# Patient Record
Sex: Female | Born: 1990 | Race: White | Hispanic: No | Marital: Single | Smoking: Current some day smoker
Health system: Southern US, Community
[De-identification: ages and names within clinical notes are randomized; demographics above are authoritative.]

## PROBLEM LIST (undated history)

## (undated) HISTORY — PX: WISDOM TOOTH EXTRACTION: SHX21

---

## 2021-02-14 ENCOUNTER — Emergency Department (HOSPITAL_BASED_OUTPATIENT_CLINIC_OR_DEPARTMENT_OTHER): Payer: 59

## 2021-02-14 ENCOUNTER — Other Ambulatory Visit: Payer: Self-pay

## 2021-02-14 ENCOUNTER — Emergency Department (HOSPITAL_BASED_OUTPATIENT_CLINIC_OR_DEPARTMENT_OTHER)
Admission: EM | Admit: 2021-02-14 | Discharge: 2021-02-14 | Disposition: A | Discharge status: Home / Self Care | Payer: 59 | Attending: Emergency Medicine | Admitting: Emergency Medicine

## 2021-02-14 ENCOUNTER — Encounter (HOSPITAL_BASED_OUTPATIENT_CLINIC_OR_DEPARTMENT_OTHER): Payer: Self-pay | Admitting: *Deleted

## 2021-02-14 DIAGNOSIS — J069 Acute upper respiratory infection, unspecified: Secondary | ICD-10-CM | POA: Diagnosis not present

## 2021-02-14 DIAGNOSIS — Z20822 Contact with and (suspected) exposure to covid-19: Secondary | ICD-10-CM | POA: Diagnosis not present

## 2021-02-14 DIAGNOSIS — F17210 Nicotine dependence, cigarettes, uncomplicated: Secondary | ICD-10-CM | POA: Insufficient documentation

## 2021-02-14 DIAGNOSIS — J029 Acute pharyngitis, unspecified: Secondary | ICD-10-CM | POA: Diagnosis present

## 2021-02-14 LAB — RAPID INFLUENZA A&B ANTIGENS
Influenza A (ARMC): NEGATIVE
Influenza B (ARMC): NEGATIVE

## 2021-02-14 NOTE — ED Provider Notes (Signed)
[facility] EMERGENCY DEPARTMENT Provider Note   CSN: [id number] Arrival date & time: 02/14/21  0740     History Chief Complaint  Patient presents with   Cough    Chloe Sims is a 30 y.o. female.  For the past week patient states that she has been having intermittent body aches, chills, sore throat.  No voice change, no difficulty swallowing, no difficulty breathing.  Does feel like her tonsils may be swollen.  No fevers that she is aware of but has had chills.  Denies any associated chest pain.  No vomiting.  No known sick contacts.  She denies any chronic medical problems.  HPI     No past medical history on file.  There are no problems to display for this patient.  OB History   No obstetric history on file.     History reviewed. No pertinent family history.  Social History   Tobacco Use   Smoking status: Some Days    Types: Cigarettes  Substance Use Topics   Alcohol use: Yes    Comment: social   Drug use: Yes    Types: Marijuana    Home Medications Prior to Admission medications   Not on File    Allergies    Patient has no known allergies.  Review of Systems   Review of Systems  Constitutional:  Positive for chills and fatigue. Negative for fever.  HENT:  Negative for ear pain and sore throat.   Eyes:  Negative for pain and visual disturbance.  Respiratory:  Positive for cough. Negative for shortness of breath.   Cardiovascular:  Negative for chest pain and palpitations.  Gastrointestinal:  Negative for abdominal pain and vomiting.  Genitourinary:  Negative for dysuria and hematuria.  Musculoskeletal:  Positive for arthralgias and myalgias. Negative for back pain.  Skin:  Negative for color change and rash.  Neurological:  Negative for seizures and syncope.  All other systems reviewed and are negative.  Physical Exam Updated Vital Signs BP (!) 145/70 (BP Location: Right Arm)   Pulse 74   Temp 98.5 F (36.9 C) (Oral)   Resp  18   Ht 5\' 3"  (1.6 m)   Wt 81.6 kg   SpO2 99%   BMI 31.89 kg/m   Physical Exam Vitals and nursing note reviewed.  Constitutional:      General: She is not in acute distress.    Appearance: She is well-developed.  HENT:     Head: Normocephalic and atraumatic.     Mouth/Throat:     Comments: No significant erythema or exudates appreciated, oropharynx patent Eyes:     Conjunctiva/sclera: Conjunctivae normal.  Cardiovascular:     Rate and Rhythm: Normal rate and regular rhythm.     Heart sounds: No murmur heard. Pulmonary:     Effort: Pulmonary effort is normal. No respiratory distress.     Breath sounds: Normal breath sounds.  Abdominal:     Palpations: Abdomen is soft.     Tenderness: There is no abdominal tenderness.  Musculoskeletal:     Cervical back: Neck supple.  Skin:    General: Skin is warm and dry.  Neurological:     General: No focal deficit present.     Mental Status: She is alert.    ED Results / Procedures / Treatments   Labs (all labs ordered are listed, but only abnormal results are displayed) Labs Reviewed  RAPID INFLUENZA A&B ANTIGENS  SARS CORONAVIRUS 2 (TAT 6-24 HRS)  EKG None  Radiology DG Chest Portable 1 View  Result Date: 02/14/2021 CLINICAL DATA:  Cough, chest pain EXAM: PORTABLE CHEST 1 VIEW COMPARISON:  None. FINDINGS: Normal heart size. Normal mediastinal contour. No pneumothorax. No pleural effusion. Lungs appear clear, with no acute consolidative airspace disease and no pulmonary edema. IMPRESSION: No active disease. Electronically Signed   By: Ilona Sorrel M.D.   On: 02/14/2021 10:49    Procedures Procedures   Medications Ordered in ED Medications - No data to display  ED Course  I have reviewed the triage vital signs and the nursing notes.  Pertinent labs & imaging results that were available during my care of the patient were reviewed by me and considered in my medical decision making (see chart for details).    MDM  Rules/Calculators/A&P                          30 year old lady presents to ER with concern for congestion and cough over the past couple weeks.  She is currently nontoxic-appearing, grossly normal vital signs, lungs are clear to auscultation bilaterally.  Suspect patient may have viral upper respiratory illness.  Recommend supportive care for now and follow-up with her primary care doctor.  After the discussed management above, the patient was determined to be safe for discharge.  The patient was in agreement with this plan and all questions regarding their care were answered.  ED return precautions were discussed and the patient will return to the ED with any significant worsening of condition.   Final Clinical Impression(s) / ED Diagnoses Final diagnoses:  Viral URI with cough    Rx / DC Orders ED Discharge Orders     None        Lucrezia Starch, MD 02/15/21 [phone]

## 2021-02-14 NOTE — Discharge Instructions (Signed)
Follow-up with your primary care doctor later this week.  If you develop difficulty breathing, chest pain or other new concerning symptom, come back to ER for reassessment.  The flu test was negative.  The x-ray of your chest did not show any pneumonia.  We suspect you most likely have a viral upper respiratory illness.

## 2021-02-14 NOTE — ED Triage Notes (Signed)
Having body aches, chills, sore throat for the past week. Has a strong cough. Feels like her tonsils are swollen

## 2021-02-15 LAB — SARS CORONAVIRUS 2 (TAT 6-24 HRS): SARS Coronavirus 2: NEGATIVE

## 2022-03-10 ENCOUNTER — Ambulatory Visit: Payer: 59 | Admitting: Nurse Practitioner

## 2023-08-13 IMAGING — DX DG CHEST 1V PORT
1 series · 1 of 1 positions shown · non-contrast
Comparison: None.

CLINICAL DATA: Cough, chest pain

EXAM:
PORTABLE CHEST 1 VIEW

[chest ap]
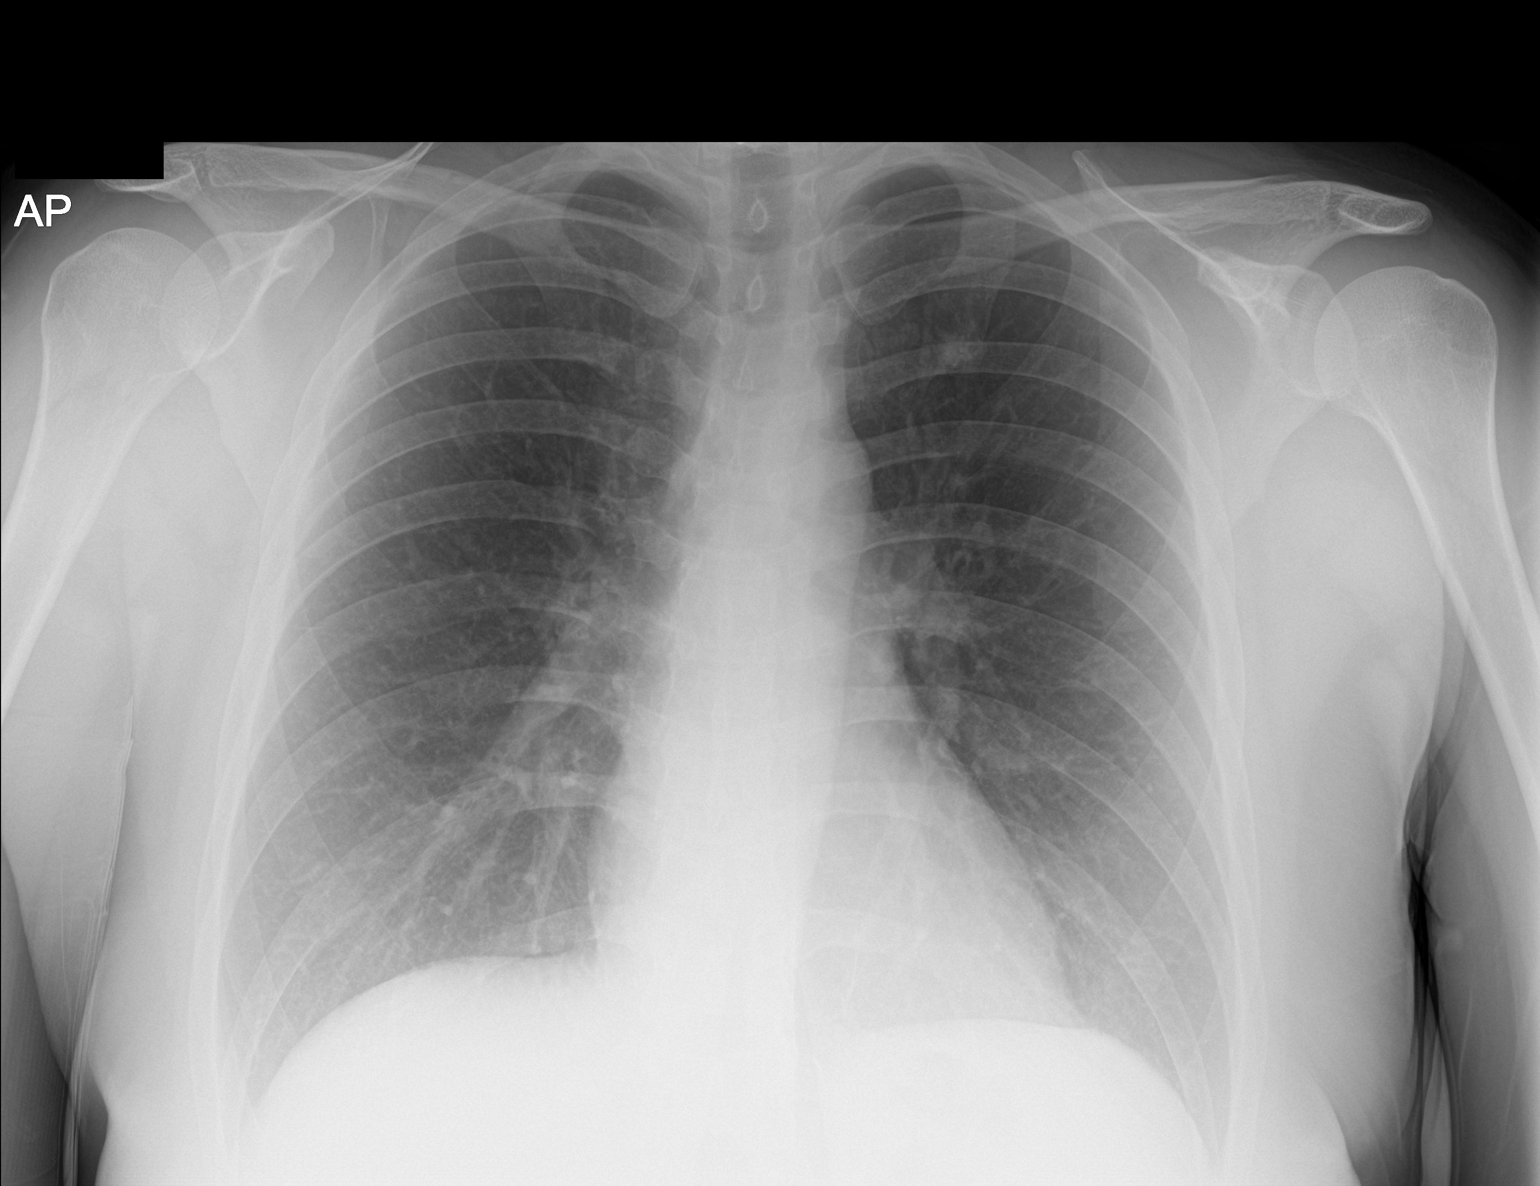

[1 of 1 positions shown; findings below may reference images not displayed]

FINDINGS: Normal heart size. Normal mediastinal contour. No pneumothorax. No
pleural effusion. Lungs appear clear, with no acute consolidative
airspace disease and no pulmonary edema.
IMPRESSION: No active disease.

## 2023-09-27 ENCOUNTER — Other Ambulatory Visit: Payer: Self-pay

## 2023-09-27 ENCOUNTER — Encounter (HOSPITAL_BASED_OUTPATIENT_CLINIC_OR_DEPARTMENT_OTHER): Payer: Self-pay

## 2023-09-27 ENCOUNTER — Emergency Department (HOSPITAL_BASED_OUTPATIENT_CLINIC_OR_DEPARTMENT_OTHER)

## 2023-09-27 ENCOUNTER — Emergency Department (HOSPITAL_BASED_OUTPATIENT_CLINIC_OR_DEPARTMENT_OTHER)
Admission: EM | Admit: 2023-09-27 | Discharge: 2023-09-27 | Disposition: A | Discharge status: Home / Self Care | Attending: Emergency Medicine | Admitting: Emergency Medicine

## 2023-09-27 DIAGNOSIS — R072 Precordial pain: Secondary | ICD-10-CM | POA: Insufficient documentation

## 2023-09-27 DIAGNOSIS — R200 Anesthesia of skin: Secondary | ICD-10-CM | POA: Diagnosis present

## 2023-09-27 DIAGNOSIS — R202 Paresthesia of skin: Secondary | ICD-10-CM | POA: Insufficient documentation

## 2023-09-27 DIAGNOSIS — M542 Cervicalgia: Secondary | ICD-10-CM | POA: Diagnosis not present

## 2023-09-27 LAB — COMPREHENSIVE METABOLIC PANEL WITH GFR
ALT: 13 U/L (ref 0–44)
AST: 17 U/L (ref 15–41)
Albumin: 4.2 g/dL (ref 3.5–5.0)
Alkaline Phosphatase: 72 U/L (ref 38–126)
Anion gap: 10 (ref 5–15)
BUN: 10 mg/dL (ref 6–20)
CO2: 24 mmol/L (ref 22–32)
Calcium: 9.3 mg/dL (ref 8.9–10.3)
Chloride: 104 mmol/L (ref 98–111)
Creatinine, Ser: 0.94 mg/dL (ref 0.44–1.00)
GFR, Estimated: >60 mL/min (ref >60)
Glucose, Bld: 102 mg/dL — ABNORMAL HIGH (ref 70–99)
Potassium: 4.3 mmol/L (ref 3.5–5.1)
Sodium: 138 mmol/L (ref 135–145)
Total Bilirubin: 0.5 mg/dL (ref 0.0–1.2)
Total Protein: 7 g/dL (ref 6.5–8.1)

## 2023-09-27 LAB — MAGNESIUM: Magnesium: 2.1 mg/dL (ref 1.7–2.4)

## 2023-09-27 LAB — CBC WITH DIFFERENTIAL/PLATELET
Abs Immature Granulocytes: 0.02 10*3/uL (ref 0.00–0.07)
Basophils Absolute: 0.1 10*3/uL (ref 0.0–0.1)
Basophils Relative: 1 %
Eosinophils Absolute: 0.3 10*3/uL (ref 0.0–0.5)
Eosinophils Relative: 4 %
HCT: 43.8 % (ref 36.0–46.0)
Hemoglobin: 14.6 g/dL (ref 12.0–15.0)
Immature Granulocytes: 0 %
Lymphocytes Relative: 18 %
Lymphs Abs: 1.2 10*3/uL (ref 0.7–4.0)
MCH: 31.1 pg (ref 26.0–34.0)
MCHC: 33.3 g/dL (ref 30.0–36.0)
MCV: 93.4 fL (ref 80.0–100.0)
Monocytes Absolute: 0.4 10*3/uL (ref 0.1–1.0)
Monocytes Relative: 6 %
Neutro Abs: 4.9 10*3/uL (ref 1.7–7.7)
Neutrophils Relative %: 71 %
Platelets: 268 10*3/uL (ref 150–400)
RBC: 4.69 MIL/uL (ref 3.87–5.11)
RDW: 12.3 % (ref 11.5–15.5)
WBC: 6.9 10*3/uL (ref 4.0–10.5)
nRBC: 0 % (ref 0.0–0.2)

## 2023-09-27 LAB — HCG, SERUM, QUALITATIVE: Preg, Serum: NEGATIVE

## 2023-09-27 LAB — LIPASE, BLOOD: Lipase: 17 U/L (ref 11–51)

## 2023-09-27 LAB — D-DIMER, QUANTITATIVE: D-Dimer, Quant: <0.27 ug{FEU}/mL (ref 0.00–0.50)

## 2023-09-27 LAB — TROPONIN T, HIGH SENSITIVITY: Troponin T High Sensitivity: <15 ng/L (ref <19)

## 2023-09-27 MED ORDER — ONDANSETRON 4 MG PO TBDP: 4.0000 mg | ORAL_TABLET | Freq: Three times a day (TID) | ORAL | 0 refills | Status: AC | PRN

## 2023-09-27 MED ORDER — SODIUM CHLORIDE 0.9 % IV BOLUS
500.0000 mL | Freq: Once | INTRAVENOUS | Status: AC
Start: 2023-09-27 — End: 2023-09-27
  Administered 2023-09-27: 500 mL via INTRAVENOUS

## 2023-09-27 NOTE — Discharge Instructions (Signed)
 You were seen in the emerged from today with chest pains and tingling/numbness to the left arm.  Would like you to monitor your symptoms closely and follow with the primary care doctor.  I am referring you to a cardiologist as well.  They should call for an appointment.  If you develop sudden worsening chest pain, trouble breathing, numbness/weakness that does not resolve you should return to the emergency department.

## 2023-09-27 NOTE — ED Provider Notes (Signed)
 Emergency Department Provider Note   I have reviewed the triage vital signs and the nursing notes.   HISTORY  Chief Complaint Numbness and Chest Pain   HPI Chloe Sims is a 33 y.o. female with past history reviewed below presents to the emergency department with left arm numbness and intermittent, sharp chest discomfort.  Patient states that she has had intermittent tingling in the feet for a Danel Requena time and occasionally into the fingers.  She has some mild neck discomfort from time to time but no injury.  Last night at 7 PM she developed diffuse numbness throughout her entire left arm which is new.  She describes continuing to have sensation but feeling like a general tightness or pins/needles sensation.  Symptoms resolved slowly throughout the night.  She has some residual tightness in her left upper arm.  She notes over the past several weeks she has had intermittent sharp pains in the chest without clear provoking factor.  No pleuritic pain.  No exertional component to her discomfort.  No active chest pain.  No diaphoresis or nausea/vomiting.   History reviewed. No pertinent past medical history.  Review of Systems  Constitutional: No fever/chills Cardiovascular: Positive chest pain. Respiratory: Denies shortness of breath. Gastrointestinal: No abdominal pain.  No nausea, no vomiting.   Skin: Negative for rash. Neurological: Negative for headaches, focal weakness or numbness. MSK: Left arm tightness/numbness.    ____________________________________________   PHYSICAL EXAM:  VITAL SIGNS: ED Triage Vitals  Encounter Vitals Group     BP 09/27/23 1013 (!) 156/105     Pulse Rate 09/27/23 1013 71     Resp 09/27/23 1013 15     Temp 09/27/23 1013 98.2 F (36.8 C)     Temp Source 09/27/23 1013 Oral     SpO2 09/27/23 1013 100 %     Weight 09/27/23 1010 198 lb (89.8 kg)     Height 09/27/23 1010 5' 3 (1.6 m)   Constitutional: Alert and oriented. Well appearing and in  no acute distress. Eyes: Conjunctivae are normal.  Head: Atraumatic. Nose: No congestion/rhinnorhea. Mouth/Throat: Mucous membranes are moist.   Neck: No stridor.   Cardiovascular: Normal rate, regular rhythm. Good peripheral circulation. Grossly normal heart sounds.   Respiratory: Normal respiratory effort.  No retractions. Lungs CTAB. Gastrointestinal: Soft and nontender. No distention.  Musculoskeletal: No lower extremity tenderness nor edema. No gross deformities of extremities. Neurologic:  Normal speech and language.  5/5 strength of bilateral upper and lower extremities.  Normal sensation in the bilateral upper extremities.  No pronator drift. Skin:  Skin is warm, dry and intact. No rash noted.  ____________________________________________   LABS (all labs ordered are listed, but only abnormal results are displayed)  Labs Reviewed  COMPREHENSIVE METABOLIC PANEL WITH GFR - Abnormal; Notable for the following components:      Result Value   Glucose, Bld 102 (*)    All other components within normal limits  LIPASE, BLOOD  CBC WITH DIFFERENTIAL/PLATELET  HCG, SERUM, QUALITATIVE  D-DIMER, QUANTITATIVE  MAGNESIUM  TROPONIN T, HIGH SENSITIVITY   ____________________________________________  EKG   EKG Interpretation Date/Time:  Wednesday September 27 2023 10:12:22 EDT Ventricular Rate:  63 PR Interval:  126 QRS Duration:  82 QT Interval:  383 QTC Calculation: 392 R Axis:   56  Text Interpretation: Sinus rhythm Baseline wander in lead(s) V4 Confirmed by Darra Chew [phone]) on 09/27/2023 11:36:49 AM       ____________________________________________   PROCEDURES  Procedure(s) performed:  Procedures  None  ____________________________________________   INITIAL IMPRESSION / ASSESSMENT AND PLAN / ED COURSE  Pertinent labs & imaging results that were available during my care of the patient were reviewed by me and considered in my medical decision making (see chart  for details).   This patient is Presenting for Evaluation of CP, which does require a range of treatment options, and is a complaint that involves a high risk of morbidity and mortality.  The Differential Diagnoses includes but is not exclusive to acute coronary syndrome, aortic dissection, pulmonary embolism, cardiac tamponade, community-acquired pneumonia, pericarditis, musculoskeletal chest wall pain, etc.   Critical Interventions-    Medications  sodium chloride  0.9 % bolus 500 mL (0 mLs Intravenous Stopped 09/27/23 1309)    Reassessment after intervention:  symptoms improved.    Clinical Laboratory Tests Ordered, included CBC with leukocytosis.  Normal electrolytes.  D-dimer normal.  Pregnancy negative.  Troponin normal.  Radiologic Tests Ordered, included CXR. I independently interpreted the images and agree with radiology interpretation.   Cardiac Monitor Tracing which shows NSR.    Social Determinants of Health Risk patient is a smoker.   Medical Decision Making: Summary:  The patient presents the emergency department with atypical chest pain and paresthesias to the left upper extremity.  No active neurosymptoms.  Normal sensation throughout the arm.  No motor symptoms.  Plan for chest pain evaluation.  Clinically low suspicion for stroke/TIA.  Reevaluation with update and discussion with patient.  Chest pain is fairly atypical.  Workup is largely unremarkable.  Stable for discharge with close follow-up.  Considered admission but ED workup is largely unremarkable.  Patient's presentation is most consistent with acute presentation with potential threat to life or bodily function.   Disposition: discharge  ____________________________________________  FINAL CLINICAL IMPRESSION(S) / ED DIAGNOSES  Final diagnoses:  Paresthesias  Precordial chest pain     NEW OUTPATIENT MEDICATIONS STARTED DURING THIS VISIT:  Discharge Medication List as of 09/27/2023  2:47 PM      START taking these medications   Details  ondansetron  (ZOFRAN -ODT) 4 MG disintegrating tablet Take 1 tablet (4 mg total) by mouth every 8 (eight) hours as needed., Starting Wed 09/27/2023, Normal        Note:  This document was prepared using Dragon voice recognition software and may include unintentional dictation errors.  Fonda Law, MD, [facility]    Zyeir Dymek, Fonda MATSU, MD 10/06/23 [phone]

## 2023-09-27 NOTE — ED Triage Notes (Signed)
 Last night her left arm went numb from shoulder to fingers around 1900, also had nausea, dizziness. This morning, numbness is mostly improved, but still has some in upper arm. States father was diagnosed with a genetic heart failure disorder, also she is having increased stress recently.  Adds she has been having intermittent chest pain and palpitations for several weeks.

## 2024-05-03 ENCOUNTER — Ambulatory Visit: Admitting: Nurse Practitioner

## 2024-10-18 ENCOUNTER — Ambulatory Visit: Admitting: Nurse Practitioner
